# Patient Record
Sex: Male | Born: 2000 | Race: White | Hispanic: No | Marital: Single | Smoking: Current every day smoker
Health system: Southern US, Community
[De-identification: ages and names within clinical notes are randomized; demographics above are authoritative.]

---
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
## 2022-01-01 ENCOUNTER — Ambulatory Visit (HOSPITAL_COMMUNITY)
Admission: EM | Admit: 2022-01-01 | Discharge: 2022-01-01 | Disposition: A | Discharge status: Home / Self Care | Payer: Commercial Managed Care - PPO | Attending: Internal Medicine | Admitting: Internal Medicine

## 2022-01-01 ENCOUNTER — Encounter (HOSPITAL_COMMUNITY): Payer: Self-pay

## 2022-01-01 DIAGNOSIS — J029 Acute pharyngitis, unspecified: Secondary | ICD-10-CM | POA: Insufficient documentation

## 2022-01-01 LAB — POCT RAPID STREP A, ED / UC: Streptococcus, Group A Screen (Direct): NEGATIVE

## 2022-01-01 MED ORDER — IBUPROFEN 800 MG PO TABS
800.0000 mg | ORAL_TABLET | Freq: Once | ORAL | Status: AC
  Administered 2022-01-01: 800 mg via ORAL

## 2022-01-01 MED ORDER — VALACYCLOVIR HCL 1 G PO TABS: 1000.0000 mg | ORAL_TABLET | Freq: Three times a day (TID) | ORAL | 0 refills | Status: AC

## 2022-01-01 MED ORDER — IBUPROFEN 800 MG PO TABS
ORAL_TABLET | ORAL | Status: AC
  Filled 2022-01-01: qty 1

## 2022-01-01 MED ORDER — IBUPROFEN 800 MG PO TABS: 800.0000 mg | ORAL_TABLET | Freq: Three times a day (TID) | ORAL | 0 refills | Status: AC

## 2022-01-01 MED ORDER — AMOXICILLIN 500 MG PO CAPS: 500.0000 mg | ORAL_CAPSULE | Freq: Two times a day (BID) | ORAL | 0 refills | Status: AC

## 2022-01-01 NOTE — ED Triage Notes (Signed)
Pt reports irritated throat x 2-3 days. He reports canker sores around his mouth. Pt has been using mouth wash with no relief.

## 2022-01-01 NOTE — ED Provider Notes (Signed)
[facility]    CSN: [id number] Arrival date & time: 01/01/22  1519      History   Chief Complaint Chief Complaint  Patient presents with   Mouth Lesions   Sore Throat    HPI Dean Cummings is a 21 y.o. male.   Patient presents to urgent care for evaluation of his sore throat for the last 3 days.  Patient states that 3 to 4 days ago, he developed some canker sores to the back of his mouth.  The canker sores spread to his uvula, then down the back of his throat a couple of days ago.  He states that he is usually able to get his recurrent canker sores to go away with mouthwash gargles, but the sores have persisted in his throat pain became worse a couple of days ago.  He states that his throat pain without swallowing is a 3 on a scale of 0-10 and an 8 on a scale of 0-10 when he swallows.  He has not attempted use of any over-the-counter medications to help with his pain or throat swelling.  No muffled voice sounds, diarrhea, abdominal pain, nausea, vomiting, fever/chills at home, back pain, ear pain, headache, or eye pain.  No shortness of breath, chest pain, or dizziness.  States that his urine has been slightly darker than usual, but attributes this to decreased oral intake due to throat pain over the last few days.  No recent new sexual partners.  He is in a monogamous relationship with his wife.  No recent antibiotic use abdomen doxycycline twice daily for acne vulgaris.  He receives routine dental care every 6 months.      History reviewed. No pertinent past medical history.  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 No pertinent surgical history.     Home Medications    Prior to Admission medications   Medication Sig Start Date End Date Taking? Authorizing Provider  amoxicillin (AMOXIL) 500 MG capsule Take 1 capsule (500 mg total) by mouth 2 (two) times daily for 10 days. 01/01/22 01/11/22 Yes Talbot Grumbling, FNP  ibuprofen (ADVIL) 800 MG tablet Take 1 tablet (800 mg total) by mouth 3 (three) times daily. 01/01/22  Yes Talbot Grumbling, FNP  valACYclovir (VALTREX) 1000 MG tablet Take 1 tablet (1,000 mg total) by mouth 3 (three) times daily for 7 days. 01/01/22 01/08/22 Yes Cerys Winget, Stasia Cavalier, FNP  amitriptyline (ELAVIL) 10 MG tablet Take 10 mg by mouth at bedtime. 12/20/21   [provider]  dicyclomine (BENTYL) 20 MG tablet Take 20 mg by mouth 3 (three) times daily as needed. 12/12/21   [provider]  doxycycline (VIBRAMYCIN) 100 MG capsule Take 100 mg by mouth 2 (two) times daily. 06/05/21   [provider]  Ferrous Sulfate (IRON PO) Take by mouth.    [provider]  hydrOXYzine (ATARAX) 10 MG tablet Take 10 mg by mouth 2 (two) times daily as needed. 02/15/21   [provider]  omeprazole (PRILOSEC) 40 MG capsule Take 40 mg by mouth daily. 06/09/21   [provider]    Family History History  reviewed. No pertinent family history.  Social History Social History   Tobacco Use   Smoking status: Every Day    Types: Cigarettes   Smokeless tobacco: Never  Substance Use Topics   Alcohol use: Not Currently     Allergies   Patient has no known allergies.   Review of Systems Review of Systems Per HPI  Physical Exam Triage Vital Signs ED Triage Vitals [01/01/22 1541]  Enc Vitals Group     BP (!) 150/74     Pulse Rate 84     Resp 16     Temp 98.4 F (36.9 C)     Temp Source Oral     SpO2 97 %     Weight      Height      Head Circumference      Peak Flow      Pain Score      Pain Loc      Pain Edu?       Excl. in [place]?    No data found.  Updated Vital Signs BP (!) 150/74 (BP Location: Left Arm)   Pulse 84   Temp 98.4 F (36.9 C) (Oral)   Resp 16   SpO2 97%   Visual Acuity Right Eye Distance:   Left Eye Distance:   Bilateral Distance:    Right Eye Near:   Left Eye Near:    Bilateral Near:     Physical Exam Vitals and nursing note reviewed.  Constitutional:      Appearance: Normal appearance. He is not ill-appearing or toxic-appearing.     Comments: Very pleasant patient sitting on exam in position of comfort table in no acute distress.   HENT:     Head: Normocephalic and atraumatic.     Right Ear: Hearing, tympanic membrane, ear canal and external ear normal.     Left Ear: Hearing, tympanic membrane, ear canal and external ear normal.     Nose: Nose normal.     Mouth/Throat:     Lips: Pink.     Mouth: Mucous membranes are moist.     Comments: Cobblestoning present to the posterior oropharynx.  Small white slightly ulcerated appearing lesions present to the patient's uvula and tonsils that are also consistent in appearance with tonsil stones.  Posterior oropharynx is erythematous with small amount of clear mucus postnasal drainage. Eyes:     General: Lids are normal. Vision grossly intact. Gaze aligned appropriately.     Extraocular Movements: Extraocular movements intact.     Conjunctiva/sclera: Conjunctivae normal.     Pupils: Pupils are equal, round, and reactive to light.  Cardiovascular:     Rate and Rhythm: Normal rate and regular rhythm.     Heart sounds: Normal heart sounds, S1 normal and S2 normal.  Pulmonary:     Effort: Pulmonary effort is normal. No respiratory distress.     Breath sounds: Normal breath sounds and air entry.  Abdominal:     General: Bowel sounds are normal.     Palpations: Abdomen is soft.     Tenderness: There is no abdominal tenderness. There is no right CVA tenderness, left CVA tenderness or guarding.  Musculoskeletal:     Cervical  back: Neck supple.  Lymphadenopathy:     Cervical: Cervical adenopathy present.  Skin:    General: Skin is warm and dry.     Capillary Refill: Capillary refill takes less than 2 seconds.     Findings: No rash.  Neurological:  General: No focal deficit present.     Mental Status: He is alert and oriented to person, place, and time. Mental status is at baseline.     Cranial Nerves: No dysarthria or facial asymmetry.     Gait: Gait is intact.  Psychiatric:        Mood and Affect: Mood normal.        Speech: Speech normal.        Behavior: Behavior normal.        Thought Content: Thought content normal.        Judgment: Judgment normal.      UC Treatments / Results  Labs (all labs ordered are listed, but only abnormal results are displayed) Labs Reviewed  CULTURE, GROUP A STREP [facility])  POCT RAPID STREP A, ED / UC    EKG   Radiology No results found.  Procedures Procedures (including critical care time)  Medications Ordered in UC Medications  ibuprofen (ADVIL) tablet 800 mg (800 mg Oral Given 01/01/22 1631)    Initial Impression / Assessment and Plan / UC Course  I have reviewed the triage vital signs and the nursing notes.  Pertinent labs & imaging results that were available during my care of the patient were reviewed by me and considered in my medical decision making (see chart for details).  1.  Pharyngitis Group A strep testing is negative in clinic.  Throat culture pending.  Throat is very erythematous with multiple white lesions that appear to be tonsil stones, but also have a slightly ulcerated appearance.  Plan to treat for possible bacterial pharyngitis with amoxicillin twice daily for the next 10 days.  Plan to cover for possible herpes infection with Valtrex 3 times daily for the next 7 days.  Ibuprofen 800 mg every 8 hours as needed for pain.   Discussed physical exam and available lab work findings in clinic with patient.  Counseled patient regarding  appropriate use of medications and potential side effects for all medications recommended or prescribed today. Discussed red flag signs and symptoms of worsening condition,when to call the PCP office, return to urgent care, and when to seek higher level of care in the emergency department. Patient verbalizes understanding and agreement with plan. All questions answered. Patient discharged in stable condition.   Final Clinical Impressions(s) / UC Diagnoses   Final diagnoses:  Pharyngitis, unspecified etiology     Discharge Instructions      Take Valtrex 3 times daily (every 8 hours) for the next 7 days.  Take amoxicillin twice daily (every 12 hours) for the next 10 days.   Take ibuprofen 800 mg every 8 hours as needed for throat pain and swelling.  Your next dose of ibuprofen can be tonight overnight at 12:30 AM if needed.  Take this medicine with food to avoid GI upset.  If you feel like the throat swelling is getting worse in the next few days, or if your symptoms are not improving, please return to urgent care for reevaluation.  If your symptoms are severe, please go to the emergency room for evaluation.      ED Prescriptions     Medication Sig Dispense Auth. Provider   valACYclovir (VALTREX) 1000 MG tablet Take 1 tablet (1,000 mg total) by mouth 3 (three) times daily for 7 days. 21 tablet Joella Prince M, FNP   amoxicillin (AMOXIL) 500 MG capsule Take 1 capsule (500 mg total) by mouth 2 (two) times daily for 10 days. 20 capsule Joella Prince M,  FNP   ibuprofen (ADVIL) 800 MG tablet Take 1 tablet (800 mg total) by mouth 3 (three) times daily. 21 tablet Talbot Grumbling, FNP      PDMP not reviewed this encounter.   Talbot Grumbling, [place] 01/04/22 2151

## 2022-01-02 LAB — CULTURE, GROUP A STREP (THRC)

## 2022-01-04 LAB — CULTURE, GROUP A STREP (THRC)
